# Patient Record
Sex: Female | Born: 1970 | Race: Black or African American | Hispanic: No | Marital: Married | Smoking: Never smoker
Health system: Southern US, Community
[De-identification: ages and names within clinical notes are randomized; demographics above are authoritative.]

## PROBLEM LIST (undated history)

## (undated) HISTORY — PX: WRIST SURGERY: SHX841

## (undated) HISTORY — PX: VEIN SURGERY: SHX48

## (undated) HISTORY — PX: TUBAL LIGATION: SHX77

---

## 2008-04-30 ENCOUNTER — Emergency Department (HOSPITAL_BASED_OUTPATIENT_CLINIC_OR_DEPARTMENT_OTHER): Admission: EM | Admit: 2008-04-30 | Discharge: 2008-04-30 | Payer: Self-pay | Admitting: Emergency Medicine

## 2008-05-01 ENCOUNTER — Emergency Department (HOSPITAL_BASED_OUTPATIENT_CLINIC_OR_DEPARTMENT_OTHER): Admission: EM | Admit: 2008-05-01 | Discharge: 2008-05-01 | Payer: Self-pay | Admitting: Emergency Medicine

## 2009-11-03 ENCOUNTER — Emergency Department (HOSPITAL_BASED_OUTPATIENT_CLINIC_OR_DEPARTMENT_OTHER): Admission: EM | Admit: 2009-11-03 | Discharge: 2009-11-04 | Payer: Self-pay | Admitting: Emergency Medicine

## 2010-04-21 ENCOUNTER — Emergency Department (HOSPITAL_BASED_OUTPATIENT_CLINIC_OR_DEPARTMENT_OTHER): Admission: EM | Admit: 2010-04-21 | Discharge: 2010-04-22 | Payer: Self-pay | Admitting: Emergency Medicine

## 2010-12-01 LAB — DIFFERENTIAL
Eosinophils Relative: 3 % (ref 0–5)
Lymphocytes Relative: 44 % (ref 12–46)
Lymphs Abs: 3.3 10*3/uL (ref 0.7–4.0)
Monocytes Absolute: 1.1 10*3/uL — ABNORMAL HIGH (ref 0.1–1.0)
Monocytes Relative: 14 % — ABNORMAL HIGH (ref 3–12)

## 2010-12-01 LAB — CBC
HCT: 32.4 % — ABNORMAL LOW (ref 36.0–46.0)
MCV: 91.3 fL (ref 78.0–100.0)
RBC: 3.55 MIL/uL — ABNORMAL LOW (ref 3.87–5.11)
RDW: 12.6 % (ref 11.5–15.5)
WBC: 7.6 10*3/uL (ref 4.0–10.5)

## 2010-12-01 LAB — BASIC METABOLIC PANEL
BUN: 17 mg/dL (ref 6–23)
Chloride: 107 mEq/L (ref 96–112)
Potassium: 3.8 mEq/L (ref 3.5–5.1)

## 2011-03-16 ENCOUNTER — Emergency Department (HOSPITAL_BASED_OUTPATIENT_CLINIC_OR_DEPARTMENT_OTHER)
Admission: EM | Admit: 2011-03-16 | Discharge: 2011-03-16 | Disposition: A | Discharge status: Home / Self Care | Attending: Emergency Medicine | Admitting: Emergency Medicine

## 2011-03-16 DIAGNOSIS — T394X5A Adverse effect of antirheumatics, not elsewhere classified, initial encounter: Secondary | ICD-10-CM | POA: Insufficient documentation

## 2011-03-16 DIAGNOSIS — R42 Dizziness and giddiness: Secondary | ICD-10-CM | POA: Insufficient documentation

## 2012-06-30 ENCOUNTER — Emergency Department (HOSPITAL_BASED_OUTPATIENT_CLINIC_OR_DEPARTMENT_OTHER)

## 2012-06-30 ENCOUNTER — Encounter (HOSPITAL_BASED_OUTPATIENT_CLINIC_OR_DEPARTMENT_OTHER): Payer: Self-pay | Admitting: Family Medicine

## 2012-06-30 ENCOUNTER — Emergency Department (HOSPITAL_BASED_OUTPATIENT_CLINIC_OR_DEPARTMENT_OTHER)
Admission: EM | Admit: 2012-06-30 | Discharge: 2012-06-30 | Disposition: A | Discharge status: Home / Self Care | Attending: Emergency Medicine | Admitting: Emergency Medicine

## 2012-06-30 DIAGNOSIS — M79609 Pain in unspecified limb: Secondary | ICD-10-CM | POA: Insufficient documentation

## 2012-06-30 DIAGNOSIS — M79661 Pain in right lower leg: Secondary | ICD-10-CM

## 2012-06-30 MED ORDER — NAPROXEN 500 MG PO TABS: 500.0000 mg | ORAL_TABLET | Freq: Two times a day (BID) | ORAL | Status: DC

## 2012-06-30 MED ORDER — OXYCODONE-ACETAMINOPHEN 5-325 MG PO TABS: 1.0000 | ORAL_TABLET | ORAL | Status: DC | PRN

## 2012-06-30 NOTE — ED Notes (Signed)
Patient transported to Ultrasound 

## 2012-06-30 NOTE — ED Notes (Signed)
Back from us

## 2012-06-30 NOTE — ED Notes (Signed)
Pt c/o "knot" to right calf since last Wed. Pt c/o pain with touch and walking. Pt denies shob, denies recent travel.

## 2012-06-30 NOTE — ED Provider Notes (Signed)
History     CSN: [id number]  Arrival date & time 06/30/12  1004   First MD Initiated Contact with Patient 06/30/12 1035      Chief Complaint  Patient presents with  . Leg Pain    (Consider location/radiation/quality/duration/timing/severity/associated sxs/prior treatment) Patient is a 41 y.o. female presenting with leg pain. The history is provided by the patient.  Leg Pain   She noticed a painful knot on her right calf 5 days ago. It is not getting worse, but it is not getting better. She denies any trauma. It is worse with standing and walking. Pain is 4/10 currently, but was 8/10 at its worst. She denies chest pain, dyspnea. She's not treated it with any medications.  History reviewed. No pertinent past medical history.  Past Surgical History  Procedure Date  . Tubal ligation   . Vein surgery   . Wrist surgery     No family history on file.  History  Substance Use Topics  . Smoking status: Never Smoker   . Smokeless tobacco: Not on file  . Alcohol Use: No    OB History    Grav Para Term Preterm Abortions TAB SAB Ect Mult Living                  Review of Systems  All other systems reviewed and are negative.    Allergies  Sulfa antibiotics  Home Medications  No current outpatient prescriptions on file.  BP 120/53  Pulse 79  Temp 99 F (37.2 C) (Oral)  Resp 18  Ht 5\' 7"  (1.702 m)  Wt 165 lb (74.844 kg)  BMI 25.84 kg/m2  SpO2 100%  LMP 06/10/2012  Physical Exam  Nursing note and vitals reviewed. 41year old female, resting comfortably and in no acute distress. Vital signs are normal. Oxygen saturation is 100%, which is normal. Head is normocephalic and atraumatic. PERRLA, EOMI. Oropharynx is clear. Neck is nontender and supple without adenopathy or JVD. Back is nontender and there is no CVA tenderness. Lungs are clear without rales, wheezes, or rhonchi. Chest is nontender. Heart has regular rate and rhythm without murmur. Abdomen is soft,  flat, nontender without masses or hepatosplenomegaly and peristalsis is normoactive. Extremities have no cyanosis or edema, full range of motion is present. Mildly tender subcutaneous nodules are present in the distal right calf. Right calf circumference is 1.5 cm greater than left calf circumference. There is no edema present. Homans sign is negative. Distal pulses and capillary refill are normal. Skin is warm and dry without rash. Neurologic: Mental status is normal, cranial nerves are intact, there are no motor or sensory deficits.   ED Course  Procedures (including critical care time)  US Venous Img Lower Unilateral Right  06/30/2012  *RADIOLOGY REPORT*  Clinical Data: Palpable lump right calf; past history of varicose vein ablation bilaterally  RIGHT LOWER EXTREMITY VENOUS DUPLEX ULTRASOUND  Technique:  Gray-scale sonography with graded compression, as well as color Doppler and duplex ultrasound, were performed to evaluate the deep venous system of the lower extremity from the level of the common femoral vein through the popliteal and proximal calf veins. Spectral Doppler was utilized to evaluate flow at rest and with distal augmentation maneuvers.  Comparison:  None  Findings: Deep venous system patent and compressible from right groin through popliteal fossa. Spontaneous venous flow present with intact augmentation and evidence of respiratory phasicity. No intraluminal thrombus identified. Visualized portion of the greater saphenous system unremarkable. No focal sonographic abnormalities identified  at the site of palpable concern.  IMPRESSION: No evidence of deep venous thrombosis in the right lower extremity.   Original Report Authenticated By: Lollie Marrow, M.D.      1. Pain of right lower leg       MDM  Calf pain of uncertain cause. Ultrasound will be obtained to rule out DVT.  Ultrasound does not show evidence of phlebitis. She is sent home with prescriptions for naproxen and  Percocet.     Dione Booze, MD 06/30/12 [phone]

## 2013-02-21 ENCOUNTER — Emergency Department (HOSPITAL_BASED_OUTPATIENT_CLINIC_OR_DEPARTMENT_OTHER)
Admission: EM | Admit: 2013-02-21 | Discharge: 2013-02-22 | Disposition: A | Discharge status: Home / Self Care | Attending: Emergency Medicine | Admitting: Emergency Medicine

## 2013-02-21 ENCOUNTER — Encounter (HOSPITAL_BASED_OUTPATIENT_CLINIC_OR_DEPARTMENT_OTHER): Payer: Self-pay | Admitting: *Deleted

## 2013-02-21 ENCOUNTER — Emergency Department (HOSPITAL_BASED_OUTPATIENT_CLINIC_OR_DEPARTMENT_OTHER)

## 2013-02-21 DIAGNOSIS — Z79899 Other long term (current) drug therapy: Secondary | ICD-10-CM | POA: Insufficient documentation

## 2013-02-21 DIAGNOSIS — M79675 Pain in left toe(s): Secondary | ICD-10-CM

## 2013-02-21 DIAGNOSIS — M79609 Pain in unspecified limb: Secondary | ICD-10-CM | POA: Insufficient documentation

## 2013-02-21 NOTE — ED Notes (Signed)
Pt c/o left great toe pain x 1 week. Saw PCP, but no xrays were taken. Continues to have pain and no relief with Ibuprofen and home remedies.

## 2013-02-21 NOTE — ED Provider Notes (Signed)
History  This chart was scribed for Jones Skene, MD by Jiles Prows, ED Scribe. The patient was seen in room MH02/MH02 and the patient's care was started at 11:59 PM.  CSN: [id number]  Arrival date & time 02/21/13  2205  Chief Complaint  Patient presents with  . Toe Pain    The history is provided by the patient and a significant other. No language interpreter was used.   HPI Comments: Jody Sanchez is a 42 y.o. female who presents to the Emergency Department complaining of moderate, constant, achy left toe pain that began about one week ago.  She rates this pain as a 5-6/10 when walking.  She claims her foot is sore to the touch.  Pt reports that she saw her PCP earlier this week, and he suggested that it was neither broken nor gout. She reports that she has taken 600 mg of ibuprofen 3 times a day for the past 5 days with no relief.  Pt denies headache, diaphoresis, fever, chills, nausea, vomiting, diarrhea, weakness, cough, SOB and any other pain. Pt denies any other joint problems.  Pt denies alcohol and smoking.  History reviewed. No pertinent past medical history.  Past Surgical History  Procedure Laterality Date  . Tubal ligation    . Vein surgery    . Wrist surgery      History reviewed. No pertinent family history.  History  Substance Use Topics  . Smoking status: Never Smoker   . Smokeless tobacco: Not on file  . Alcohol Use: No    OB History   Grav Para Term Preterm Abortions TAB SAB Ect Mult Living                  Review of Systems At least 10pt or greater review of systems completed and are negative except where specified in the HP.  Allergies  Sulfa antibiotics  Home Medications   Current Outpatient Rx  Name  Route  Sig  Dispense  Refill  . calcium carbonate (OS-CAL) 600 MG TABS   Oral   Take 1,200 mg by mouth daily.         Marland Kitchen loratadine-pseudoephedrine (CLARITIN-D 24-HOUR) 10-240 MG per 24 hr tablet   Oral   Take 1 tablet by mouth daily.          Marland Kitchen omeprazole (PRILOSEC) 20 MG capsule   Oral   Take 20 mg by mouth daily.         . naproxen (NAPROSYN) 500 MG tablet   Oral   Take 1 tablet (500 mg total) by mouth 2 (two) times daily.   30 tablet   0   . oxyCODONE-acetaminophen (ROXICET) 5-325 MG per tablet   Oral   Take 1 tablet by mouth every 4 (four) hours as needed for pain.   20 tablet   0     BP 127/46  Pulse 102  Temp(Src) 98.7 F (37.1 C) (Oral)  Resp 14  Ht 5\' 7"  (1.702 m)  Wt 173 lb (78.472 kg)  BMI 27.09 kg/m2  SpO2 98%  LMP 01/21/2013  Physical Exam  Nursing notes reviewed.  Electronic medical record reviewed. VITAL SIGNS:   Filed Vitals:   02/21/13 2214  BP: 127/46  Pulse: 102  Temp: 98.7 F (37.1 C)  TempSrc: Oral  Resp: 14  Height: 5\' 7"  (1.702 m)  Weight: 173 lb (78.472 kg)  SpO2: 98%   CONSTITUTIONAL: Awake, oriented, appears non-toxic HENT: Atraumatic, normocephalic, oral mucosa pink and moist, airway patent.  Nares patent without drainage. External ears normal. EYES: Conjunctiva clear, EOMI, PERRLA NECK: Trachea midline, non-tender, supple CARDIOVASCULAR: Normal heart rate, Normal rhythm, No murmurs, rubs, gallops PULMONARY/CHEST: Clear to auscultation, no rhonchi, wheezes, or rales. Symmetrical breath sounds. Non-tender. ABDOMINAL: Non-distended, soft, non-tender - no rebound or guarding.  BS normal. NEUROLOGIC: Non-focal, moving all four extremities, no gross sensory or motor deficits. EXTREMITIES: No clubbing, cyanosis, or edema. Small amount of swelling around the first MTP of the left foot. Tenderness to palpation especially on the medial aspect of the first MTP. No redness, able to range the joint actively and passively without severe pain. SKIN: Warm, Dry, No erythema, No rash  ED Course  Procedures (including critical care time) DIAGNOSTIC STUDIES: Oxygen Saturation is 98% on RA, normal by my interpretation.    COORDINATION OF CARE: 12:02 AM - Discussed ED treatment with  pt at bedside and pt agrees.  12:48 AM - Local anesthetic marcaine injection administered over left great toe joint.   Labs Reviewed - No data to display Dg Toe Great Left  02/21/2013   *RADIOLOGY REPORT*  Clinical Data: Left great toe pain.  LEFT GREAT TOE  Comparison: None  Findings: There is no evidence of fracture, subluxation or dislocation. The joint spaces are unremarkable. No focal bony lesions are present. No radiopaque foreign bodies are identified.  IMPRESSION: Unremarkable exam.   Original Report Authenticated By: Harmon Pier, M.D.     1. Pain of left great toe       MDM  Patient complaining about left great toe pain. Patient seemed primary care physician who do not think a fracture was present. I agree, do not think fractures present, x-ray of the toe shows no acute fracture, likewise this joint does not look infected, it's not completely consistent with gout however there is still a possibility. I do not think it is enough of a possibility to start this patient on colchicine however. Still treat this patient conservatively with NSAIDs. Also give the patient an injection into the medial aspect of her MTP for relief with bupivacaine. I discussed risks and benefits of placing a small amount of local anesthetic in this region including risk of bleeding, infection and damage to adjacent structures, patient understands risks and benefits of this small procedure and agrees to proceed with it. Patient received great amount of relief with placement of bupivacaine.  We'll also have the patient placed in a postop shoe, avoid bending the toe, avoid any high healed shoes- wear comfortable wide toe box shoes. Followup as needed    I personally performed the services described in this documentation, which was scribed in my presence. The recorded information has been reviewed and is accurate. Jones Skene, M.D.     Jones Skene, MD 02/22/13 [phone]

## 2013-02-22 MED ORDER — BUPIVACAINE HCL 0.5 % IJ SOLN
50.0000 mL | Freq: Once | INTRAMUSCULAR | Status: DC
  Filled 2013-02-22: qty 1

## 2013-02-26 NOTE — ED Notes (Signed)
Opened this chart due to pts primary care office ...[facility] [place] states pt came to them stating they had to fill out an authorization form opened chart to see if pt had been referred to another provider.

## 2013-04-05 ENCOUNTER — Encounter (HOSPITAL_BASED_OUTPATIENT_CLINIC_OR_DEPARTMENT_OTHER): Payer: Self-pay | Admitting: *Deleted

## 2013-04-05 ENCOUNTER — Emergency Department (HOSPITAL_BASED_OUTPATIENT_CLINIC_OR_DEPARTMENT_OTHER)

## 2013-04-05 ENCOUNTER — Emergency Department (HOSPITAL_BASED_OUTPATIENT_CLINIC_OR_DEPARTMENT_OTHER)
Admission: EM | Admit: 2013-04-05 | Discharge: 2013-04-05 | Disposition: A | Discharge status: Home / Self Care | Attending: Emergency Medicine | Admitting: Emergency Medicine

## 2013-04-05 DIAGNOSIS — J209 Acute bronchitis, unspecified: Secondary | ICD-10-CM | POA: Insufficient documentation

## 2013-04-05 DIAGNOSIS — J3489 Other specified disorders of nose and nasal sinuses: Secondary | ICD-10-CM | POA: Insufficient documentation

## 2013-04-05 DIAGNOSIS — Z791 Long term (current) use of non-steroidal anti-inflammatories (NSAID): Secondary | ICD-10-CM | POA: Insufficient documentation

## 2013-04-05 DIAGNOSIS — R079 Chest pain, unspecified: Secondary | ICD-10-CM | POA: Insufficient documentation

## 2013-04-05 DIAGNOSIS — R509 Fever, unspecified: Secondary | ICD-10-CM | POA: Insufficient documentation

## 2013-04-05 DIAGNOSIS — R0602 Shortness of breath: Secondary | ICD-10-CM | POA: Insufficient documentation

## 2013-04-05 DIAGNOSIS — Z79899 Other long term (current) drug therapy: Secondary | ICD-10-CM | POA: Insufficient documentation

## 2013-04-05 DIAGNOSIS — J40 Bronchitis, not specified as acute or chronic: Secondary | ICD-10-CM

## 2013-04-05 DIAGNOSIS — R110 Nausea: Secondary | ICD-10-CM | POA: Insufficient documentation

## 2013-04-05 MED ORDER — ALBUTEROL SULFATE HFA 108 (90 BASE) MCG/ACT IN AERS
1.0000 | INHALATION_SPRAY | RESPIRATORY_TRACT | Status: DC | PRN
  Administered 2013-04-05: 2 via RESPIRATORY_TRACT
  Filled 2013-04-05: qty 6.7

## 2013-04-05 MED ORDER — HYDROCODONE-ACETAMINOPHEN 7.5-325 MG/15ML PO SOLN: 15.0000 mL | Freq: Four times a day (QID) | ORAL | Status: AC | PRN

## 2013-04-05 MED ORDER — AZITHROMYCIN 250 MG PO TABS: 250.0000 mg | ORAL_TABLET | Freq: Every day | ORAL | Status: DC

## 2013-04-05 NOTE — ED Provider Notes (Signed)
History    This chart was scribed for Gwyneth Sprout, MD by Quintella Reichert, ED scribe.  This patient was seen in room MH09/MH09 and the patient's care was started at 4:22 PM.   CSN: [id number]  Arrival date & time 04/05/13  1457    Chief Complaint  Patient presents with  . Cough    The history is provided by the patient. No language interpreter was used.     HPI Comments: Jody Sanchez is a 42 y.o. female who presents to the Emergency Department complaining of 4 days of progressively-worsening moderate cough, with accompanying congestion, SOB, waxing-and-waning fever, sore throat, and intermittent nausea.  Pt notes recent contact with similar symptoms.  She reports that her cough is worse at night.  It is not exacerbated or alleviated by anything.  She also complains of some burning CP caused by her cough.  She notes that her fever has been improving but that her respiratory symptoms have been worsening.  On admission temperature is 99.6 F.  She denies wheezing, emesis, diarrhea or any other associated symptoms.  She denies h/o asthma.     History reviewed. No pertinent past medical history.   Past Surgical History  Procedure Laterality Date  . Tubal ligation    . Vein surgery    . Wrist surgery    . Tubal ligation      History reviewed. No pertinent family history.   History  Substance Use Topics  . Smoking status: Never Smoker   . Smokeless tobacco: Not on file  . Alcohol Use: No    OB History   Grav Para Term Preterm Abortions TAB SAB Ect Mult Living                  Review of Systems A complete 10 system review of systems was obtained and all systems are negative except as noted in the HPI and PMH.     Allergies  Sulfa antibiotics  Home Medications   Current Outpatient Rx  Name  Route  Sig  Dispense  Refill  . calcium carbonate (OS-CAL) 600 MG TABS   Oral   Take 1,200 mg by mouth daily.         Marland Kitchen loratadine-pseudoephedrine (CLARITIN-D 24-HOUR)  10-240 MG per 24 hr tablet   Oral   Take 1 tablet by mouth daily.         . naproxen (NAPROSYN) 500 MG tablet   Oral   Take 1 tablet (500 mg total) by mouth 2 (two) times daily.   30 tablet   0   . omeprazole (PRILOSEC) 20 MG capsule   Oral   Take 20 mg by mouth daily.         Marland Kitchen oxyCODONE-acetaminophen (ROXICET) 5-325 MG per tablet   Oral   Take 1 tablet by mouth every 4 (four) hours as needed for pain.   20 tablet   0    BP 126/64  Pulse 78  Temp(Src) 99.6 F (37.6 C) (Oral)  Ht 5\' 6"  (1.676 m)  Wt 165 lb (74.844 kg)  BMI 26.64 kg/m2  SpO2 97%  LMP 03/17/2013  Physical Exam  Nursing note and vitals reviewed. Constitutional: She is oriented to person, place, and time. She appears well-developed and well-nourished. No distress.  HENT:  Head: Normocephalic and atraumatic.  Right Ear: Tympanic membrane normal.  Left Ear: Tympanic membrane normal.  Swollen nasal turbinates bilaterally. Pharyngeal erythema without exudate or tonsillar enlargement.  Eyes: EOM are normal.  Neck:  Neck supple. No tracheal deviation present.  Cardiovascular: Normal rate.   Pulmonary/Chest: Effort normal and breath sounds normal. No respiratory distress. She has no wheezes. She has no rales.  Musculoskeletal: Normal range of motion.  Neurological: She is alert and oriented to person, place, and time.  Skin: Skin is warm and dry.  Psychiatric: She has a normal mood and affect. Her behavior is normal.    ED Course  Procedures (including critical care time)  DIAGNOSTIC STUDIES: Oxygen Saturation is 97% on room air, normal by my interpretation.    COORDINATION OF CARE: 4:27 PM-Discussed treatment plan which includes albuterol inhaler and antibiotics with pt at bedside and pt agreed to plan.     Labs Reviewed - No data to display  Dg Chest 2 View  04/05/2013   *RADIOLOGY REPORT*  Clinical Data: Left chest discomfort, cough, fever  CHEST - 2 VIEW  Comparison: None.  Findings: Lungs  are essentially clear.  No focal consolidation.  No pleural effusion or pneumothorax.  The heart is normal in size.  Mild degenerative changes of the visualized thoracolumbar spine.  IMPRESSION: No evidence of acute cardiopulmonary disease.   Original Report Authenticated By: Charline Bills, M.D.   1. Bronchitis     MDM   Pt with symptoms consistent with viral URI.  Well appearing here.  No signs of breathing difficulty  No signs of pharyngitis, otitis or abnormal abdominal findings.   CXR wnl and pt to return with any further problems.     I personally performed the services described in this documentation, which was scribed in my presence.  The recorded information has been reviewed and considered.    Gwyneth Sprout, MD 04/05/13 2255

## 2013-04-05 NOTE — ED Notes (Signed)
Patient with c/o cough that started a few days ago.  Patient c/o fever that started on Wednesday and a few days later, she started coughing.  Chest hurts when she coughs and voice is hoarse.

## 2013-06-17 ENCOUNTER — Encounter (HOSPITAL_BASED_OUTPATIENT_CLINIC_OR_DEPARTMENT_OTHER): Payer: Self-pay | Admitting: Emergency Medicine

## 2013-06-17 ENCOUNTER — Emergency Department (HOSPITAL_BASED_OUTPATIENT_CLINIC_OR_DEPARTMENT_OTHER)
Admission: EM | Admit: 2013-06-17 | Discharge: 2013-06-17 | Disposition: A | Discharge status: Home / Self Care | Attending: Emergency Medicine | Admitting: Emergency Medicine

## 2013-06-17 DIAGNOSIS — J069 Acute upper respiratory infection, unspecified: Secondary | ICD-10-CM | POA: Insufficient documentation

## 2013-06-17 DIAGNOSIS — Z792 Long term (current) use of antibiotics: Secondary | ICD-10-CM | POA: Insufficient documentation

## 2013-06-17 DIAGNOSIS — Z79899 Other long term (current) drug therapy: Secondary | ICD-10-CM | POA: Insufficient documentation

## 2013-06-17 DIAGNOSIS — R0602 Shortness of breath: Secondary | ICD-10-CM | POA: Insufficient documentation

## 2013-06-17 DIAGNOSIS — R0789 Other chest pain: Secondary | ICD-10-CM | POA: Insufficient documentation

## 2013-06-17 DIAGNOSIS — R05 Cough: Secondary | ICD-10-CM

## 2013-06-17 DIAGNOSIS — Z791 Long term (current) use of non-steroidal anti-inflammatories (NSAID): Secondary | ICD-10-CM | POA: Insufficient documentation

## 2013-06-17 MED ORDER — ALBUTEROL SULFATE HFA 108 (90 BASE) MCG/ACT IN AERS
2.0000 | INHALATION_SPRAY | RESPIRATORY_TRACT | Status: DC | PRN
  Administered 2013-06-17: 2 via RESPIRATORY_TRACT
  Filled 2013-06-17: qty 6.7

## 2013-06-17 MED ORDER — ALBUTEROL SULFATE HFA 108 (90 BASE) MCG/ACT IN AERS: 2.0000 | INHALATION_SPRAY | RESPIRATORY_TRACT | Status: DC | PRN

## 2013-06-17 NOTE — ED Notes (Signed)
Pt stated she had bronchitis 2 months ago and finished all med. Still has cough and cough has gotten more productive in the last week.

## 2013-06-17 NOTE — ED Notes (Signed)
Pt c/o cough x 1 week. Pt states cough has been dry until today with mild sputum production. Pt states burning and tightness in chest started today.

## 2013-06-17 NOTE — ED Provider Notes (Signed)
CSN: [id number]     Arrival date & time 06/17/13  1912 History  This chart was scribed for Shon Baton, MD by Karle Plumber, ED Scribe. This patient was seen in room MH11/MH11 and the patient's care was started at 7:48 PM.  Chief Complaint  Patient presents with  . Cough   The history is provided by the patient. No language interpreter was used.   HPI Comments:  Jody Sanchez is a 42 y.o. female who presents to the Emergency Department complaining of intermittent, moderate cough onset one week. Pt states the cough was unproductive until today she started producing sputum. She reports associated chest tightness and burning onset three days that worsens when coughing. Pt reports taking Robitussin with mild relief. Pt reports using an inhaler in the past for treatment of bronchitis. Pt denies urinary symptoms, fevers, or any other pertinent illnesses or symptoms. She states her PCP is [facility] in [place].  History reviewed. No pertinent past medical history. Past Surgical History  Procedure Laterality Date  . Tubal ligation    . Vein surgery    . Wrist surgery    . Tubal ligation     No family history on file. History  Substance Use Topics  . Smoking status: Never Smoker   . Smokeless tobacco: Not on file  . Alcohol Use: No   OB History   Grav Para Term Preterm Abortions TAB SAB Ect Mult Living                 Review of Systems  Constitutional: Negative for fever.  Respiratory: Positive for cough, chest tightness and shortness of breath.        States SOB and chest tightness is due to coughing and worsens with cough.  Cardiovascular: Negative for chest pain.  Gastrointestinal: Negative for nausea, vomiting and abdominal pain.  Genitourinary: Negative for dysuria.  Musculoskeletal: Negative for back pain.  Skin: Negative for wound.  Neurological: Negative for headaches.  Psychiatric/Behavioral: Negative for confusion.  All other systems reviewed and are  negative.    Allergies  Sulfa antibiotics  Home Medications   Current Outpatient Rx  Name  Route  Sig  Dispense  Refill  . Pseudoephedrine-Ibuprofen (ADVIL COLD/SINUS PO)   Oral   Take by mouth.         Marland Kitchen albuterol (PROVENTIL HFA;VENTOLIN HFA) 108 (90 BASE) MCG/ACT inhaler   Inhalation   Inhale 2 puffs into the lungs every 4 (four) hours as needed for wheezing or shortness of breath.   1 Inhaler   0   . azithromycin (ZITHROMAX) 250 MG tablet   Oral   Take 1 tablet (250 mg total) by mouth daily. Take first 2 tablets together, then 1 every day until finished.   6 tablet   0   . calcium carbonate (OS-CAL) 600 MG TABS   Oral   Take 1,200 mg by mouth daily.         Marland Kitchen HYDROcodone-acetaminophen (HYCET) 7.5-325 mg/15 ml solution   Oral   Take 15 mLs by mouth 4 (four) times daily as needed for pain.   120 mL   0   . loratadine-pseudoephedrine (CLARITIN-D 24-HOUR) 10-240 MG per 24 hr tablet   Oral   Take 1 tablet by mouth daily.         . naproxen (NAPROSYN) 500 MG tablet   Oral   Take 1 tablet (500 mg total) by mouth 2 (two) times daily.   30 tablet  0   . omeprazole (PRILOSEC) 20 MG capsule   Oral   Take 20 mg by mouth daily.         Marland Kitchen oxyCODONE-acetaminophen (ROXICET) 5-325 MG per tablet   Oral   Take 1 tablet by mouth every 4 (four) hours as needed for pain.   20 tablet   0    Triage Vitals: BP 126/70  Pulse 68  Temp(Src) 98.7 F (37.1 C) (Oral)  Resp 18  Ht 5\' 7"  (1.702 m)  Wt 160 lb (72.576 kg)  BMI 25.05 kg/m2  SpO2 99% Physical Exam  Nursing note and vitals reviewed. Constitutional: She is oriented to person, place, and time. She appears well-developed and well-nourished.  HENT:  Head: Normocephalic and atraumatic.  Cardiovascular: Normal rate, regular rhythm and normal heart sounds.   Pulmonary/Chest: Effort normal and breath sounds normal. No respiratory distress. She has no wheezes. She has no rales. She exhibits no tenderness.   coughing  Abdominal: Soft. Bowel sounds are normal. She exhibits no distension and no mass. There is no tenderness. There is no rebound and no guarding.  Neurological: She is alert and oriented to person, place, and time.  Skin: Skin is warm and dry.  Psychiatric: She has a normal mood and affect.    ED Course  Procedures (including critical care time) DIAGNOSTIC STUDIES: Oxygen Saturation is 99% on RA, normal by my interpretation.   COORDINATION OF CARE: 7:43 PM- Advised pt to continue the OTC treatments for her symptoms and add Motrin for any chest discomfort. Will perform an EKG to r/o any cardiac issues. Will give pt albuterol MDI for any SOB. Advised to follow up with PCP if symptoms do not improve over the next 2-3 days. Pt verbalizes understanding and agrees to plan.  Medications - No data to display  Labs Review Labs Reviewed - No data to display Imaging Review No results found.  EKG independently reviewed by myself: Sinus rhythm with a rate of 59, no evidence of ST elevation or ischemia, normal EKG. No prior for comparison MDM   1. Cough   2. Upper respiratory infection    Patient with URI symptoms and predominate cough.  Does endorse chest burning.  Afebrile and nontoxic.  Will given albuterol inhaler for cough.  Screening EKG normal and low suspicion for ACS; CP likely secondary to URI.    NO indication for chest xray at this time.  Patient to follow-up with PCP.  GIven return precautions.  After history, exam, and medical workup I feel the patient has been appropriately medically screened and is safe for discharge home. Pertinent diagnoses were discussed with the patient. Patient was given return precautions.  I personally performed the services described in this documentation, which was scribed in my presence. The recorded information has been reviewed and is accurate.   Shon Baton, MD 06/18/13 [phone]

## 2014-08-17 ENCOUNTER — Encounter (HOSPITAL_BASED_OUTPATIENT_CLINIC_OR_DEPARTMENT_OTHER): Payer: Self-pay | Admitting: *Deleted

## 2014-08-17 ENCOUNTER — Emergency Department (HOSPITAL_BASED_OUTPATIENT_CLINIC_OR_DEPARTMENT_OTHER)
Admission: EM | Admit: 2014-08-17 | Discharge: 2014-08-17 | Disposition: A | Discharge status: Home / Self Care | Attending: Emergency Medicine | Admitting: Emergency Medicine

## 2014-08-17 DIAGNOSIS — Z9851 Tubal ligation status: Secondary | ICD-10-CM | POA: Insufficient documentation

## 2014-08-17 DIAGNOSIS — Z7952 Long term (current) use of systemic steroids: Secondary | ICD-10-CM | POA: Diagnosis not present

## 2014-08-17 DIAGNOSIS — Z79899 Other long term (current) drug therapy: Secondary | ICD-10-CM | POA: Diagnosis not present

## 2014-08-17 DIAGNOSIS — Z791 Long term (current) use of non-steroidal anti-inflammatories (NSAID): Secondary | ICD-10-CM | POA: Insufficient documentation

## 2014-08-17 DIAGNOSIS — R1111 Vomiting without nausea: Secondary | ICD-10-CM

## 2014-08-17 DIAGNOSIS — R112 Nausea with vomiting, unspecified: Secondary | ICD-10-CM | POA: Diagnosis not present

## 2014-08-17 DIAGNOSIS — R197 Diarrhea, unspecified: Secondary | ICD-10-CM | POA: Diagnosis present

## 2014-08-17 MED ORDER — ONDANSETRON 4 MG PO TBDP
ORAL_TABLET | ORAL | Status: AC
  Filled 2014-08-17: qty 1

## 2014-08-17 MED ORDER — ONDANSETRON 4 MG PO TBDP
4.0000 mg | ORAL_TABLET | Freq: Once | ORAL | Status: AC
  Administered 2014-08-17: 4 mg via ORAL

## 2014-08-17 MED ORDER — ONDANSETRON 4 MG PO TBDP: 4.0000 mg | ORAL_TABLET | Freq: Three times a day (TID) | ORAL | Status: AC | PRN

## 2014-08-17 NOTE — ED Notes (Signed)
Pt c/o n/d x 3 days

## 2014-08-17 NOTE — ED Provider Notes (Signed)
CSN: [id number]     Arrival date & time 08/17/14  1448 History   First MD Initiated Contact with Patient 08/17/14 1502     Chief Complaint  Patient presents with  . Diarrhea     (Consider location/radiation/quality/duration/timing/severity/associated sxs/prior Treatment) Patient is a 43 y.o. female presenting with diarrhea. The history is provided by the patient. No language interpreter was used.  Diarrhea Severity:  Moderate Onset quality:  Gradual Number of episodes:  Multiple Duration:  3 days Timing:  Constant Progression:  Unchanged Relieved by:  Nothing Worsened by:  Nothing tried Ineffective treatments:  None tried Risk factors: no recent antibiotic use   Pt here with 2 family members who have the same  History reviewed. No pertinent past medical history. Past Surgical History  Procedure Laterality Date  . Tubal ligation    . Vein surgery    . Wrist surgery    . Tubal ligation     History reviewed. No pertinent family history. History  Substance Use Topics  . Smoking status: Never Smoker   . Smokeless tobacco: Not on file  . Alcohol Use: No   OB History    No data available     Review of Systems  Gastrointestinal: Positive for diarrhea.  All other systems reviewed and are negative.     Allergies  Sulfa antibiotics  Home Medications   Prior to Admission medications   Medication Sig Start Date End Date Taking? Authorizing Provider  fluticasone (FLONASE) 50 MCG/ACT nasal spray Place into both nostrils daily.   Yes Historical Provider, MD  calcium carbonate (OS-CAL) 600 MG TABS Take 1,200 mg by mouth daily.    Historical Provider, MD  loratadine-pseudoephedrine (CLARITIN-D 24-HOUR) 10-240 MG per 24 hr tablet Take 1 tablet by mouth daily.    Historical Provider, MD  naproxen (NAPROSYN) 500 MG tablet Take 1 tablet (500 mg total) by mouth 2 (two) times daily. 06/30/12   Dione Boozeavid Glick, MD  omeprazole (PRILOSEC) 20 MG capsule Take 20 mg by mouth daily.     Historical Provider, MD  ondansetron (ZOFRAN ODT) 4 MG disintegrating tablet Take 1 tablet (4 mg total) by mouth every 8 (eight) hours as needed for nausea or vomiting. 08/17/14   Elson AreasLeslie K Peder Allums, PA-C  Pseudoephedrine-Ibuprofen (ADVIL COLD/SINUS PO) Take by mouth.    Historical Provider, MD   BP 124/68 mmHg  Pulse 100  Temp(Src) 99.7 F (37.6 C)  Resp 18  Ht 5\' 7"  (1.702 m)  Wt 180 lb (81.647 kg)  BMI 28.19 kg/m2  SpO2 97%  LMP 08/11/2014 Physical Exam  Constitutional: She appears well-developed.  HENT:  Head: Normocephalic and atraumatic.  Eyes: Conjunctivae and EOM are normal. Pupils are equal, round, and reactive to light.  Neck: Normal range of motion. Neck supple.  Cardiovascular: Normal rate and regular rhythm.   Pulmonary/Chest: Effort normal and breath sounds normal.  Abdominal: Soft.  Musculoskeletal: Normal range of motion.  Neurological: She is alert.  Skin: Skin is warm.  Nursing note and vitals reviewed.   ED Course  Procedures (including critical care time) Labs Review Labs Reviewed - No data to display  Imaging Review No results found.   EKG Interpretation None      MDM   Final diagnoses:  Vomiting without nausea, vomiting of unspecified type    Pt given odt zofran.  Pt able to tolerate po fluids.  Pt advised to follow up with her Md if symptoms persist    Elson AreasLeslie K Roseanna Koplin, [place] JerseyPA-C 08/17/14 1635  Mirian MoMatthew Gentry, MD 08/22/14 [phone]

## 2014-08-17 NOTE — Discharge Instructions (Signed)

## 2016-09-20 ENCOUNTER — Emergency Department (HOSPITAL_BASED_OUTPATIENT_CLINIC_OR_DEPARTMENT_OTHER)

## 2016-09-20 ENCOUNTER — Emergency Department (HOSPITAL_BASED_OUTPATIENT_CLINIC_OR_DEPARTMENT_OTHER)
Admission: EM | Admit: 2016-09-20 | Discharge: 2016-09-20 | Disposition: A | Discharge status: Home / Self Care | Attending: Emergency Medicine | Admitting: Emergency Medicine

## 2016-09-20 ENCOUNTER — Encounter (HOSPITAL_BASED_OUTPATIENT_CLINIC_OR_DEPARTMENT_OTHER): Payer: Self-pay

## 2016-09-20 DIAGNOSIS — M25551 Pain in right hip: Secondary | ICD-10-CM | POA: Insufficient documentation

## 2016-09-20 DIAGNOSIS — M5431 Sciatica, right side: Secondary | ICD-10-CM | POA: Insufficient documentation

## 2016-09-20 DIAGNOSIS — M79604 Pain in right leg: Secondary | ICD-10-CM | POA: Diagnosis present

## 2016-09-20 LAB — PREGNANCY, URINE: PREG TEST UR: NEGATIVE

## 2016-09-20 MED ORDER — HYDROCODONE-ACETAMINOPHEN 5-325 MG PO TABS: 1.0000 | ORAL_TABLET | Freq: Four times a day (QID) | ORAL | 0 refills | Status: DC | PRN

## 2016-09-20 MED ORDER — KETOROLAC TROMETHAMINE 60 MG/2ML IM SOLN
60.0000 mg | Freq: Once | INTRAMUSCULAR | Status: AC
  Administered 2016-09-20: 60 mg via INTRAMUSCULAR
  Filled 2016-09-20: qty 2

## 2016-09-20 MED ORDER — HYDROMORPHONE HCL 1 MG/ML IJ SOLN
2.0000 mg | Freq: Once | INTRAMUSCULAR | Status: AC
  Administered 2016-09-20: 2 mg via INTRAMUSCULAR
  Filled 2016-09-20: qty 2

## 2016-09-20 MED ORDER — NAPROXEN 500 MG PO TABS: 500.0000 mg | ORAL_TABLET | Freq: Two times a day (BID) | ORAL | 0 refills | Status: DC

## 2016-09-20 MED ORDER — CYCLOBENZAPRINE HCL 10 MG PO TABS: 10.0000 mg | ORAL_TABLET | Freq: Three times a day (TID) | ORAL | 0 refills | Status: AC | PRN

## 2016-09-20 MED FILL — NAPROXEN 500 MG TABLET: 500 | 7 days supply | Qty: 14 | Fill #0

## 2016-09-20 MED FILL — HYDROCODON-APAP 5-325: 5-325 | 2 days supply | Qty: 10 | Fill #0

## 2016-09-20 MED FILL — CYCLOBENZAPRINE 10 MG TAB: 10 | 5 days supply | Qty: 15 | Fill #0

## 2016-09-20 NOTE — ED Notes (Signed)
Pt has a ride at bedside. Directed to pharmacy to pick up Rx 

## 2016-09-20 NOTE — Discharge Instructions (Signed)
Your Xray showed a "Rounded corticated bony density" at your right hip. Your Doctor can help set up an MRI of your hip to further clarify this. If you develop trouble walking or inability to walk, come back for further evaluation.  Return immediately if you develop weakness/numbness or trouble controlling your bowel or bladder

## 2016-09-20 NOTE — ED Provider Notes (Signed)
[facility]   CSN: [id number] Arrival date & time: 09/20/16  0458     History   Chief Complaint Chief Complaint  Patient presents with  . Leg Pain    HPI Nils Flackracy Weissman is a 46 y.o. female.  HPI  46 year old female presents with severe right leg pain. States it started last week. Seemed to be worse after sitting during a long drive. Pain is at its worst at her right lateral hip. Also has some buttocks pain that radiates down into her knee. Pain has progressively become severe over the last 2 days to the point that she can't hardly bear weight on her leg and has to limp. There is no weakness, just severe pain. Has tried extra strength Tylenol with no relief. There is no weakness. She felt transient toe tingling in her right foot yesterday but no further numbness. No bowel or bladder incontinence. No trauma. Denies fevers, dysuria, hematuria, or abdominal pain. No prior back problems. When she is still her pain is minimal, severe when moving.  History reviewed. No pertinent past medical history.  There are no active problems to display for this patient.   Past Surgical History:  Procedure Laterality Date  . TUBAL LIGATION    . TUBAL LIGATION    . VEIN SURGERY    . WRIST SURGERY      OB History    No data available       Home Medications    Prior to Admission medications   Medication Sig Start Date End Date Taking? Authorizing Provider  calcium carbonate (OS-CAL) 600 MG TABS Take 1,200 mg by mouth daily.    Historical Provider, MD  cyclobenzaprine (FLEXERIL) 10 MG tablet Take 1 tablet (10 mg total) by mouth 3 (three) times daily as needed for muscle spasms. 09/20/16   Pricilla LovelessScott Dorthie Santini, MD  fluticasone (FLONASE) 50 MCG/ACT nasal spray Place into both nostrils daily.    Historical Provider, MD  HYDROcodone-acetaminophen (NORCO) 5-325 MG tablet Take 1-2 tablets by mouth every 6 (six) hours as needed for severe pain. 09/20/16   Pricilla LovelessScott Arnetra Terris, MD    loratadine-pseudoephedrine (CLARITIN-D 24-HOUR) 10-240 MG per 24 hr tablet Take 1 tablet by mouth daily.    Historical Provider, MD  naproxen (NAPROSYN) 500 MG tablet Take 1 tablet (500 mg total) by mouth 2 (two) times daily with a meal. 09/20/16   Pricilla LovelessScott Maritssa Haughton, MD  omeprazole (PRILOSEC) 20 MG capsule Take 20 mg by mouth daily.    Historical Provider, MD  ondansetron (ZOFRAN ODT) 4 MG disintegrating tablet Take 1 tablet (4 mg total) by mouth every 8 (eight) hours as needed for nausea or vomiting. 08/17/14   Elson AreasLeslie K Sofia, PA-C  Pseudoephedrine-Ibuprofen (ADVIL COLD/SINUS PO) Take by mouth.    Historical Provider, MD    Family History No family history on file.  Social History Social History  Substance Use Topics  . Smoking status: Never Smoker  . Smokeless tobacco: Never Used  . Alcohol use No     Allergies   Sulfa antibiotics   Review of Systems Review of Systems  Constitutional: Negative for fever.  Cardiovascular: Negative for leg swelling.  Gastrointestinal: Negative for abdominal pain.  Genitourinary: Negative for dysuria and hematuria.  Musculoskeletal: Positive for back pain.  Neurological: Negative for weakness and numbness.  All other systems reviewed and are negative.    Physical Exam Updated Vital Signs BP 139/68 (BP Location: Right Arm)   Pulse 90   Temp 98.5 F (36.9  C) (Oral)   Resp 20   Ht 5\' 7"  (1.702 m)   Wt 180 lb (81.6 kg)   SpO2 97%   BMI 28.19 kg/m   Physical Exam  Constitutional: She is oriented to person, place, and time. She appears well-developed and well-nourished.  HENT:  Head: Normocephalic and atraumatic.  Right Ear: External ear normal.  Left Ear: External ear normal.  Nose: Nose normal.  Eyes: Right eye exhibits no discharge. Left eye exhibits no discharge.  Cardiovascular: Normal rate and regular rhythm.   Pulses:      Dorsalis pedis pulses are 2+ on the right side, and 2+ on the left side.  Pulmonary/Chest: Effort normal.   Abdominal: She exhibits no distension.  Musculoskeletal: She exhibits no edema.       Right hip: She exhibits no tenderness.       Right knee: No tenderness found.       Thoracic back: She exhibits no tenderness.       Lumbar back: She exhibits no tenderness.       Right upper leg: She exhibits no tenderness.  No hip or greater trochanter tenderness. Pain exacerbated by ROM of hip  Neurological: She is alert and oriented to person, place, and time. She has normal reflexes.  5/5 strength in BLE. Causes severe pain in RLE but she has full strength. Normal gross sensation  Skin: Skin is warm and dry. She is not diaphoretic.  Nursing note and vitals reviewed.    ED Treatments / Results  Labs (all labs ordered are listed, but only abnormal results are displayed) Labs Reviewed  PREGNANCY, URINE    EKG  EKG Interpretation None       Radiology Dg Lumbar Spine Complete  Result Date: 09/20/2016 CLINICAL DATA:  Hip pain. EXAM: LUMBAR SPINE - COMPLETE 4+ VIEW COMPARISON:  No recent prior . FINDINGS: No acute bony abnormality identified. Normal alignment mineralization. Diffuse multilevel degenerative change. Moderate stool volume noted. IMPRESSION: No acute or focal abnormality. Diffuse multilevel degenerative change. Electronically Signed   By: Maisie Fus  Register   On: 09/20/2016 07:07   Dg Hip Unilat With Pelvis 2-3 Views Right  Result Date: 09/20/2016 CLINICAL DATA:  Pain.  No injury. EXAM: DG HIP (WITH OR WITHOUT PELVIS) 2-3V RIGHT COMPARISON:  02/17/2013 . FINDINGS: No acute bony or joint abnormality identified. Corticated bony density is noted adjacent to the right femoral neck. This may be related to old injury, ligamentous calcification, or loose body. MRI of the right hip can be obtained for further evaluation as needed. Lucency is noted over the right ilium. This appears corticated. Although an active lesion cannot be excluded. This is most likely benign cyst. IMPRESSION: 1. Rounded  corticated bony density noted adjacent to the right femoral neck. This may be related old injury, ligamentous calcification, or loose body. MRI of the right hip can be obtained for further evaluation. 2. Corticated lucency noted over the right ilium. Although an active lesion cannot be excluded. This is most likely benign cyst. 3. No acute bony abnormality. No evidence of acute fracture or dislocation. Electronically Signed   By: Maisie Fus  Register   On: 09/20/2016 07:06    Procedures Procedures (including critical care time)  Medications Ordered in ED Medications  HYDROmorphone (DILAUDID) injection 2 mg (2 mg Intramuscular Given 09/20/16 0549)  ketorolac (TORADOL) injection 60 mg (60 mg Intramuscular Given 09/20/16 0549)     Initial Impression / Assessment and Plan / ED Course  I have reviewed the  triage vital signs and the nursing notes.  Pertinent labs & imaging results that were available during my care of the patient were reviewed by me and considered in my medical decision making (see chart for details).  Clinical Course as of Sep 20 725  Thu Sep 20, 2016  1610 This is most likely sciatica. However given no prior history, will get xray of L-spine and hip given pain with ROM. IM dilaudid, IM toradol  [SG]    Clinical Course User Index [SG] Pricilla Loveless, MD    Patient's pain is much better. She is able to get up and ambulate with a mild limp. However she is bearing weight. My suspicion for an occult hip fracture is quite low. However given the nonspecific findings on her hip x-ray, I discussed the importance of getting an outpatient MRI by her PCP. Given suspicion of occult fracture is low, I do not think this needs to be done emergently. Discussed strict return precautions.  Final Clinical Impressions(s) / ED Diagnoses   Final diagnoses:  Right sided sciatica  Right hip pain    New Prescriptions New Prescriptions   CYCLOBENZAPRINE (FLEXERIL) 10 MG TABLET    Take 1 tablet (10 mg  total) by mouth 3 (three) times daily as needed for muscle spasms.   HYDROCODONE-ACETAMINOPHEN (NORCO) 5-325 MG TABLET    Take 1-2 tablets by mouth every 6 (six) hours as needed for severe pain.   NAPROXEN (NAPROSYN) 500 MG TABLET    Take 1 tablet (500 mg total) by mouth 2 (two) times daily with a meal.     Pricilla Loveless, MD 09/20/16 [id number]

## 2016-09-20 NOTE — ED Triage Notes (Signed)
Pt c/o pain to rt hip/buttocks area shooting down rt leg; states has worsen in past day; states traveling over the weekend and pain was intense. Denies injury

## 2018-06-28 ENCOUNTER — Encounter (HOSPITAL_BASED_OUTPATIENT_CLINIC_OR_DEPARTMENT_OTHER): Payer: Self-pay

## 2018-06-28 ENCOUNTER — Other Ambulatory Visit: Payer: Self-pay

## 2018-06-28 DIAGNOSIS — M5412 Radiculopathy, cervical region: Secondary | ICD-10-CM | POA: Insufficient documentation

## 2018-06-28 DIAGNOSIS — Z79899 Other long term (current) drug therapy: Secondary | ICD-10-CM | POA: Diagnosis not present

## 2018-06-28 DIAGNOSIS — M542 Cervicalgia: Secondary | ICD-10-CM | POA: Diagnosis present

## 2018-06-28 NOTE — ED Triage Notes (Signed)
Pt c/o R shoulder pain x2 days. Denies injury. Pt was seen at [facility] yesterday and prescribed NSAIDS for pain. Today pain is worse and pt states she has tingling and numbness in R fingers.

## 2018-06-29 ENCOUNTER — Emergency Department (HOSPITAL_BASED_OUTPATIENT_CLINIC_OR_DEPARTMENT_OTHER)
Admission: EM | Admit: 2018-06-29 | Discharge: 2018-06-29 | Disposition: A | Discharge status: Home / Self Care | Attending: Emergency Medicine | Admitting: Emergency Medicine

## 2018-06-29 ENCOUNTER — Emergency Department (HOSPITAL_BASED_OUTPATIENT_CLINIC_OR_DEPARTMENT_OTHER)

## 2018-06-29 DIAGNOSIS — M5412 Radiculopathy, cervical region: Secondary | ICD-10-CM

## 2018-06-29 MED ORDER — PREDNISONE 20 MG PO TABS: 40.0000 mg | ORAL_TABLET | Freq: Every day | ORAL | 0 refills | Status: AC

## 2018-06-29 MED ORDER — PREDNISONE 50 MG PO TABS
60.0000 mg | ORAL_TABLET | Freq: Once | ORAL | Status: AC
  Administered 2018-06-29: 03:00:00 60 mg via ORAL
  Filled 2018-06-29: qty 1

## 2018-06-29 MED ORDER — HYDROCODONE-ACETAMINOPHEN 5-325 MG PO TABS: 1.0000 | ORAL_TABLET | ORAL | 0 refills | Status: AC | PRN

## 2018-06-29 MED ORDER — KETOROLAC TROMETHAMINE 60 MG/2ML IM SOLN
60.0000 mg | Freq: Once | INTRAMUSCULAR | Status: AC
  Administered 2018-06-29: 60 mg via INTRAMUSCULAR
  Filled 2018-06-29: qty 2

## 2018-06-29 MED ORDER — NAPROXEN 375 MG PO TABS: 375.0000 mg | ORAL_TABLET | Freq: Two times a day (BID) | ORAL | 0 refills | Status: AC

## 2018-06-29 NOTE — ED Provider Notes (Signed)
[facility] EMERGENCY DEPARTMENT Provider Note   CSN: [id number] Arrival date & time: 06/28/18  2059     History   Chief Complaint Chief Complaint  Patient presents with  . Shoulder Pain    HPI Jody Sanchez is a 47 y.o. female.  HPI 47 year old female here with right arm pain.  The patient states that at work 2 days ago, the patient was using a standing desk and having to reach to use her mouse and keyboard.  No direct injuries.  She states that over the last 48 hours, she is developed progressively worsening shoulder and arm pain.  She describes it as an aching sensation that goes from her right upper shoulder/trapezius area to her fingers.  She has a tingling sensation in her fourth and fifth digits that is also made worse with coughing, weightbearing, or sneezing.  She states that the pain is an aching sensation that occasionally becomes sharp and shoots down her arm along the medial and distal aspects.  Denies any lower extremity weakness or numbness.  She has a history of lumbar radiculopathy and disc disease, but has no known cervical disease.  No fevers or chills.  No history of IV drug use or recent infectious symptoms.  History reviewed. No pertinent past medical history.  There are no active problems to display for this patient.   Past Surgical History:  Procedure Laterality Date  . TUBAL LIGATION    . TUBAL LIGATION    . VEIN SURGERY    . WRIST SURGERY       OB History   None      Home Medications    Prior to Admission medications   Medication Sig Start Date End Date Taking? Authorizing Provider  diclofenac (VOLTAREN) 75 MG EC tablet Take 75 mg by mouth 2 (two) times daily.   Yes [provider]  calcium carbonate (OS-CAL) 600 MG TABS Take 1,200 mg by mouth daily.    [provider]  cyclobenzaprine (FLEXERIL) 10 MG tablet Take 1 tablet (10 mg total) by mouth 3 (three) times daily as needed for muscle spasms. 09/20/16   Pricilla Loveless, MD  fluticasone (FLONASE) 50 MCG/ACT nasal spray Place into both nostrils daily.    [provider]  HYDROcodone-acetaminophen (NORCO/VICODIN) 5-325 MG tablet Take 1-2 tablets by mouth every 4 (four) hours as needed for moderate pain or severe pain. 06/29/18   Shaune Pollack, MD  loratadine-pseudoephedrine (CLARITIN-D 24-HOUR) 10-240 MG per 24 hr tablet Take 1 tablet by mouth daily.    [provider]  naproxen (NAPROSYN) 375 MG tablet Take 1 tablet (375 mg total) by mouth 2 (two) times daily with a meal for 7 days. 06/29/18 07/06/18  Shaune Pollack, MD  omeprazole (PRILOSEC) 20 MG capsule Take 20 mg by mouth daily.    [provider]  ondansetron (ZOFRAN ODT) 4 MG disintegrating tablet Take 1 tablet (4 mg total) by mouth every 8 (eight) hours as needed for nausea or vomiting. 08/17/14   Elson Areas, PA-C  predniSONE (DELTASONE) 20 MG tablet Take 2 tablets (40 mg total) by mouth daily for 5 days. 06/29/18 07/04/18  Shaune Pollack, MD  Pseudoephedrine-Ibuprofen (ADVIL COLD/SINUS PO) Take by mouth.    [provider]    Family History No family history on file.  Social History Social History   Tobacco Use  . Smoking status: Never Smoker  . Smokeless tobacco: Never Used  Substance Use Topics  . Alcohol use: No  .  Drug use: No     Allergies   Sulfa antibiotics   Review of Systems Review of Systems  Constitutional: Negative for chills, fatigue and fever.  HENT: Negative for congestion and rhinorrhea.   Eyes: Negative for visual disturbance.  Respiratory: Negative for cough, shortness of breath and wheezing.   Cardiovascular: Negative for chest pain and leg swelling.  Gastrointestinal: Negative for abdominal pain, diarrhea, nausea and vomiting.  Genitourinary: Negative for dysuria and flank pain.  Musculoskeletal: Positive for arthralgias and neck pain. Negative for neck stiffness.  Skin: Negative for rash and wound.    Allergic/Immunologic: Negative for immunocompromised state.  Neurological: Negative for syncope, weakness and headaches.  All other systems reviewed and are negative.    Physical Exam Updated Vital Signs BP 117/64 (BP Location: Left Arm)   Pulse 72   Temp 98.7 F (37.1 C) (Oral)   Resp 16   Ht 5\' 7"  (1.702 m)   Wt 83.9 kg   SpO2 98%   BMI 28.98 kg/m   Physical Exam  Constitutional: She is oriented to person, place, and time. She appears well-developed and well-nourished. No distress.  HENT:  Head: Normocephalic and atraumatic.  Eyes: Conjunctivae are normal.  Neck: Neck supple.  Full range of motion.  Mild paraspinal tenderness along the lower cervical spine.  No midline tenderness or deformity.  No carotid bruits.  Cardiovascular: Normal rate, regular rhythm and normal heart sounds. Exam reveals no friction rub.  No murmur heard. Pulmonary/Chest: Effort normal and breath sounds normal. No respiratory distress. She has no wheezes. She has no rales.  Abdominal: She exhibits no distension.  Musculoskeletal: She exhibits no edema.  Neurological: She is alert and oriented to person, place, and time. She exhibits normal muscle tone.  Strength is 5 out of 5 bilateral upper extremities proximally and distally.  There is normal sensation to light touch and pinprick in distal bilateral upper and lower extremities.  Normal brachial reflexes.  Skin: Skin is warm. Capillary refill takes less than 2 seconds.  Psychiatric: She has a normal mood and affect.  Nursing note and vitals reviewed.     ED Treatments / Results  Labs (all labs ordered are listed, but only abnormal results are displayed) Labs Reviewed - No data to display  EKG None  Radiology Dg Cervical Spine Complete  Result Date: 06/29/2018 CLINICAL DATA:  47 year old female with history of right arm and shoulder pain and numbness. Numbness extending into the fingers. EXAM: CERVICAL SPINE - COMPLETE 4+ VIEW  COMPARISON:  Cervical spine radiograph 05/01/2008. FINDINGS: There is no evidence of cervical spine fracture or prevertebral soft tissue swelling. Alignment is normal. No other significant bone abnormalities are identified. IMPRESSION: Negative cervical spine radiographs. Electronically Signed   By: Trudie Reed M.D.   On: 06/29/2018 02:46   Dg Shoulder Right  Result Date: 06/29/2018 CLINICAL DATA:  47 year old female with history of right arm and shoulder pain and numbness. EXAM: RIGHT SHOULDER - 2+ VIEW COMPARISON:  Right shoulder radiograph 02/24/2016. FINDINGS: There is no evidence of fracture or dislocation. There is no evidence of arthropathy or other focal bone abnormality. Soft tissues are unremarkable. IMPRESSION: Negative. Electronically Signed   By: Trudie Reed M.D.   On: 06/29/2018 02:48    Procedures Procedures (including critical care time)  Medications Ordered in ED Medications  predniSONE (DELTASONE) tablet 60 mg (60 mg Oral Given 06/29/18 0303)  ketorolac (TORADOL) injection 60 mg (60 mg Intramuscular Given 06/29/18 0303)     Initial  Impression / Assessment and Plan / ED Course  I have reviewed the triage vital signs and the nursing notes.  Pertinent labs & imaging results that were available during my care of the patient were reviewed by me and considered in my medical decision making (see chart for details).     47 year old female here with intermittent right arm pain.  I suspect this is actually due to a cervical radiculopathy and her symptoms follow the C7-C8 distribution.  She has a historyof known degenerative disc disease in her lower spine.  She has no evidence of significant cord compression with fully intact distal strength and sensation.  No infectious symptoms.  Plain films are unremarkable.  No signs of vascular compromise.  Will treat for radiculopathy with steroids and analgesics and refer her back to her spine surgeon.  Discussed strict return  precautions.  Final Clinical Impressions(s) / ED Diagnoses   Final diagnoses:  Cervical radiculopathy    ED Discharge Orders         Ordered    predniSONE (DELTASONE) 20 MG tablet  Daily     06/29/18 0305    HYDROcodone-acetaminophen (NORCO/VICODIN) 5-325 MG tablet  Every 4 hours PRN     06/29/18 0305    naproxen (NAPROSYN) 375 MG tablet  2 times daily with meals     06/29/18 0305           Shaune Pollack, MD 06/29/18 0327

## 2020-01-31 IMAGING — CR DG CERVICAL SPINE COMPLETE 4+V
5 series · 5 of 5 positions shown · non-contrast
Comparison: Cervical spine radiograph 05/01/2008.

CLINICAL DATA: 47-year-old female with history of right arm and
shoulder pain and numbness. Numbness extending into the fingers.

EXAM:
CERVICAL SPINE - COMPLETE 4+ VIEW

[w c-spine lat]
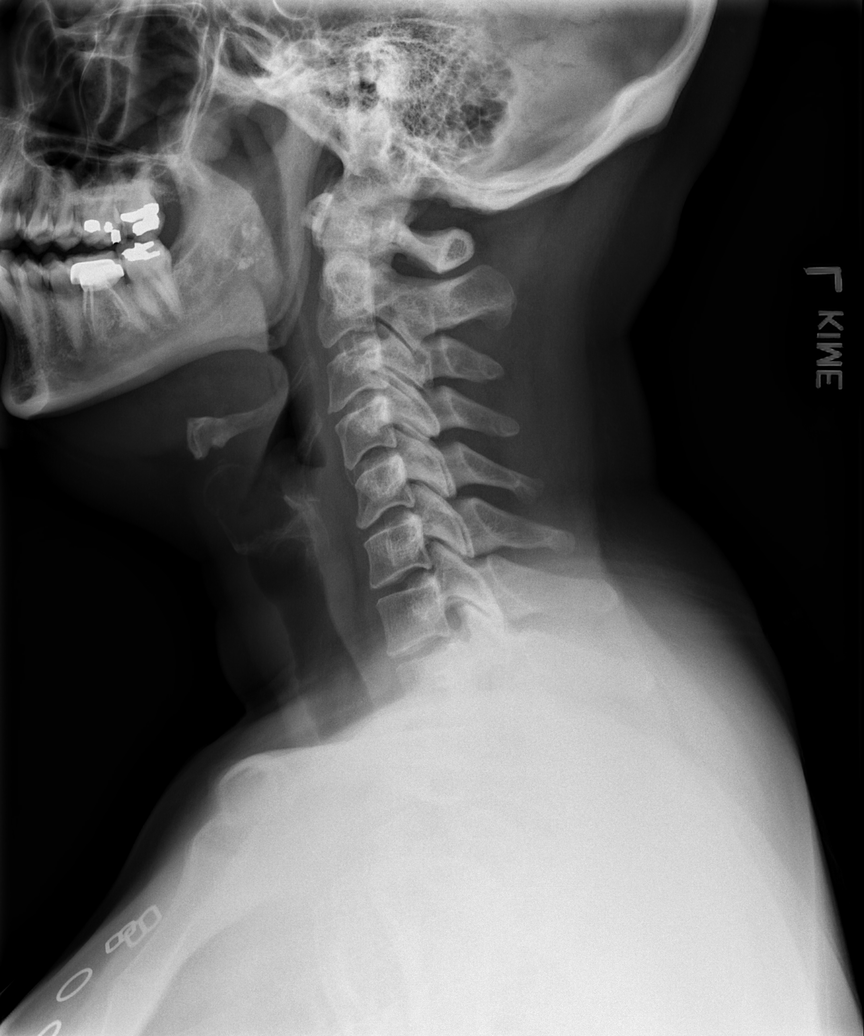

[w c-spine oblique (1 of 2)]
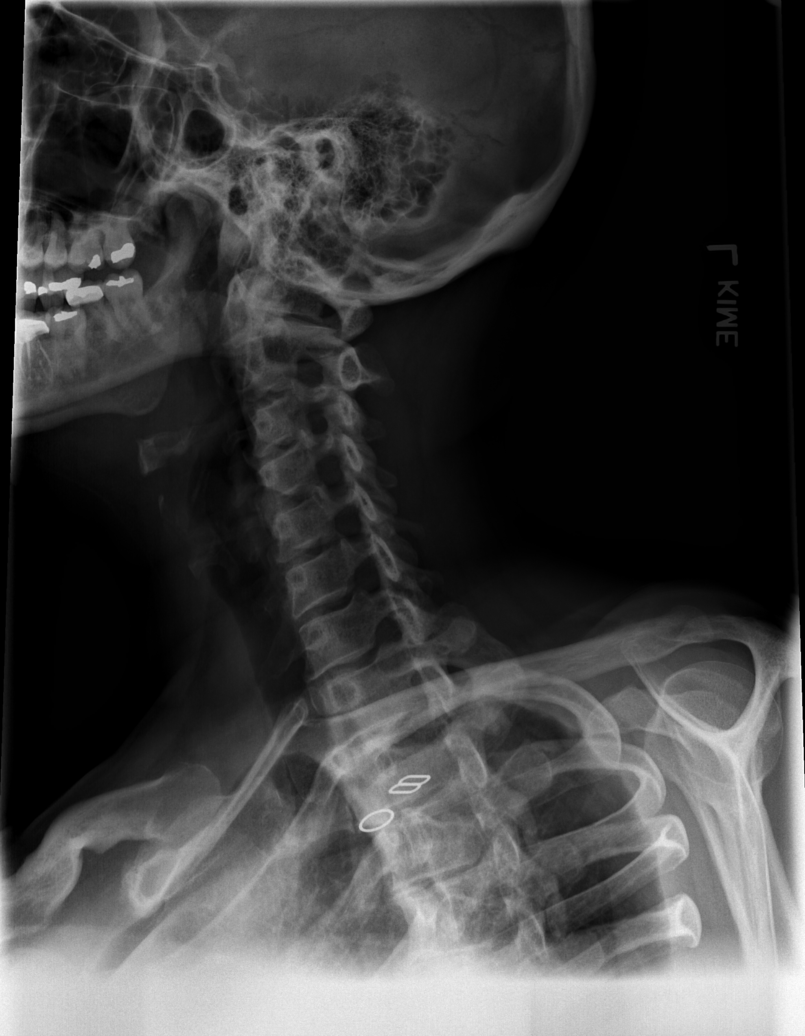

[w c-spine oblique (2 of 2)]
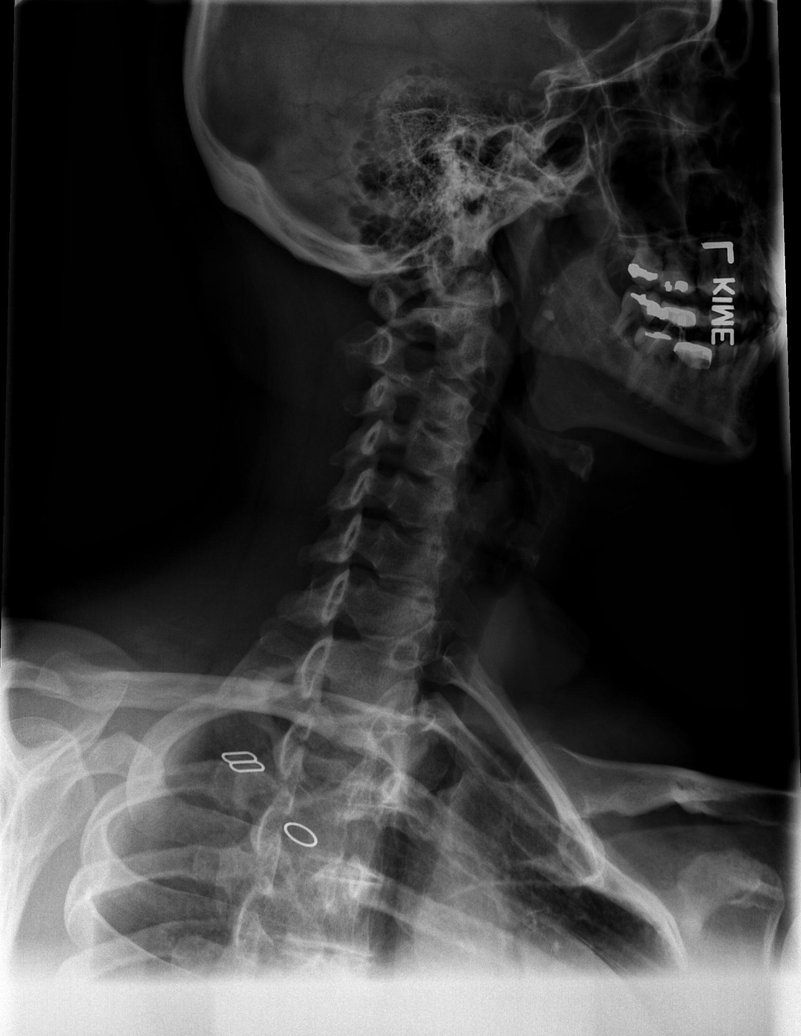

[w c-spine a.p.]
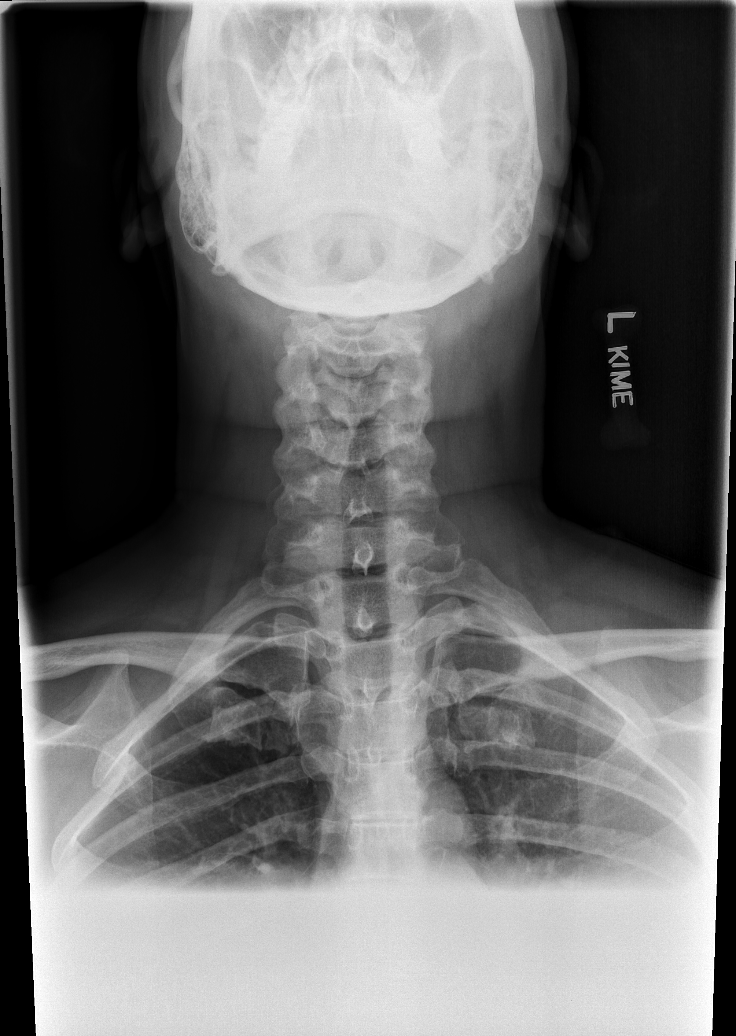

[w c-spine odontoid]
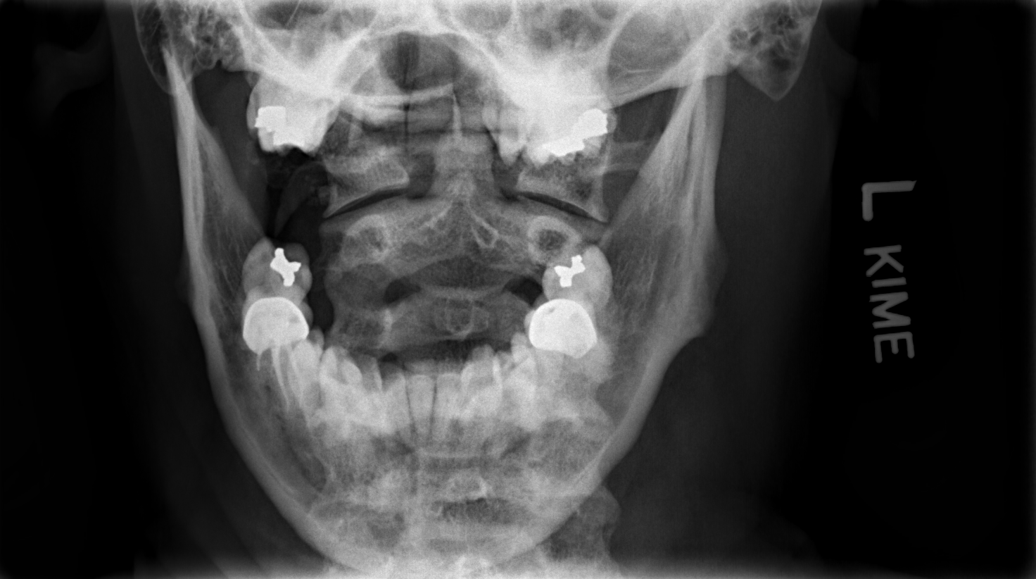

[5 of 5 positions shown; findings below may reference images not displayed]

FINDINGS: There is no evidence of cervical spine fracture or prevertebral soft
tissue swelling. Alignment is normal. No other significant bone
abnormalities are identified.
IMPRESSION: Negative cervical spine radiographs.

## 2020-01-31 IMAGING — CR DG SHOULDER 2+V*R*
3 series · 3 of 3 positions shown · non-contrast
Comparison: Right shoulder radiograph 02/24/2016.

CLINICAL DATA: 47-year-old female with history of right arm and
shoulder pain and numbness.

EXAM:
RIGHT SHOULDER - 2+ VIEW

[w shoulder grashey right]
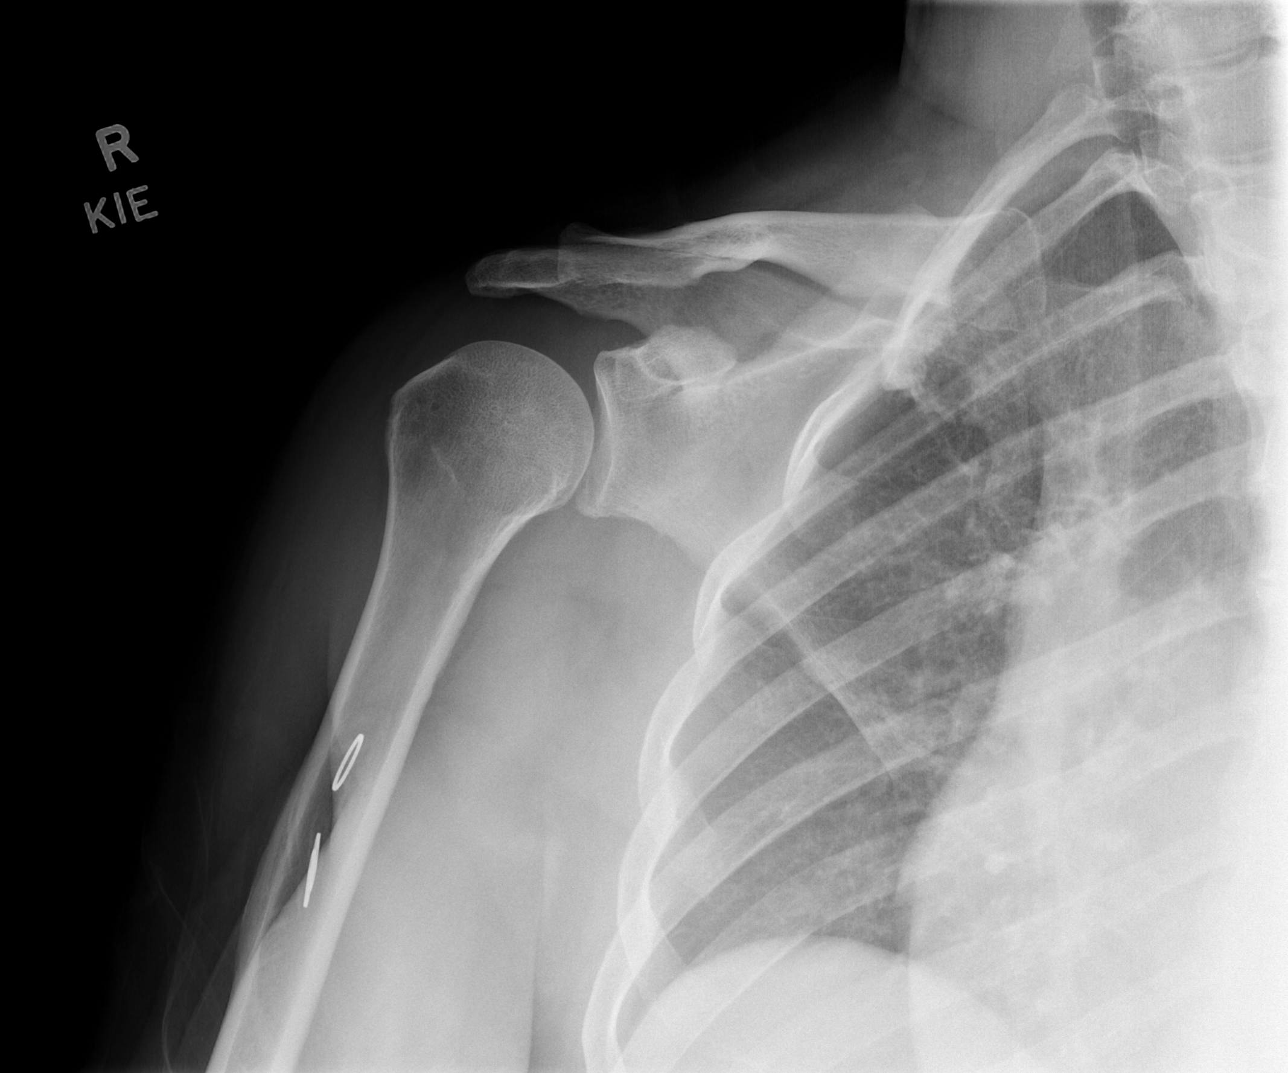

[w shoulder y view right]
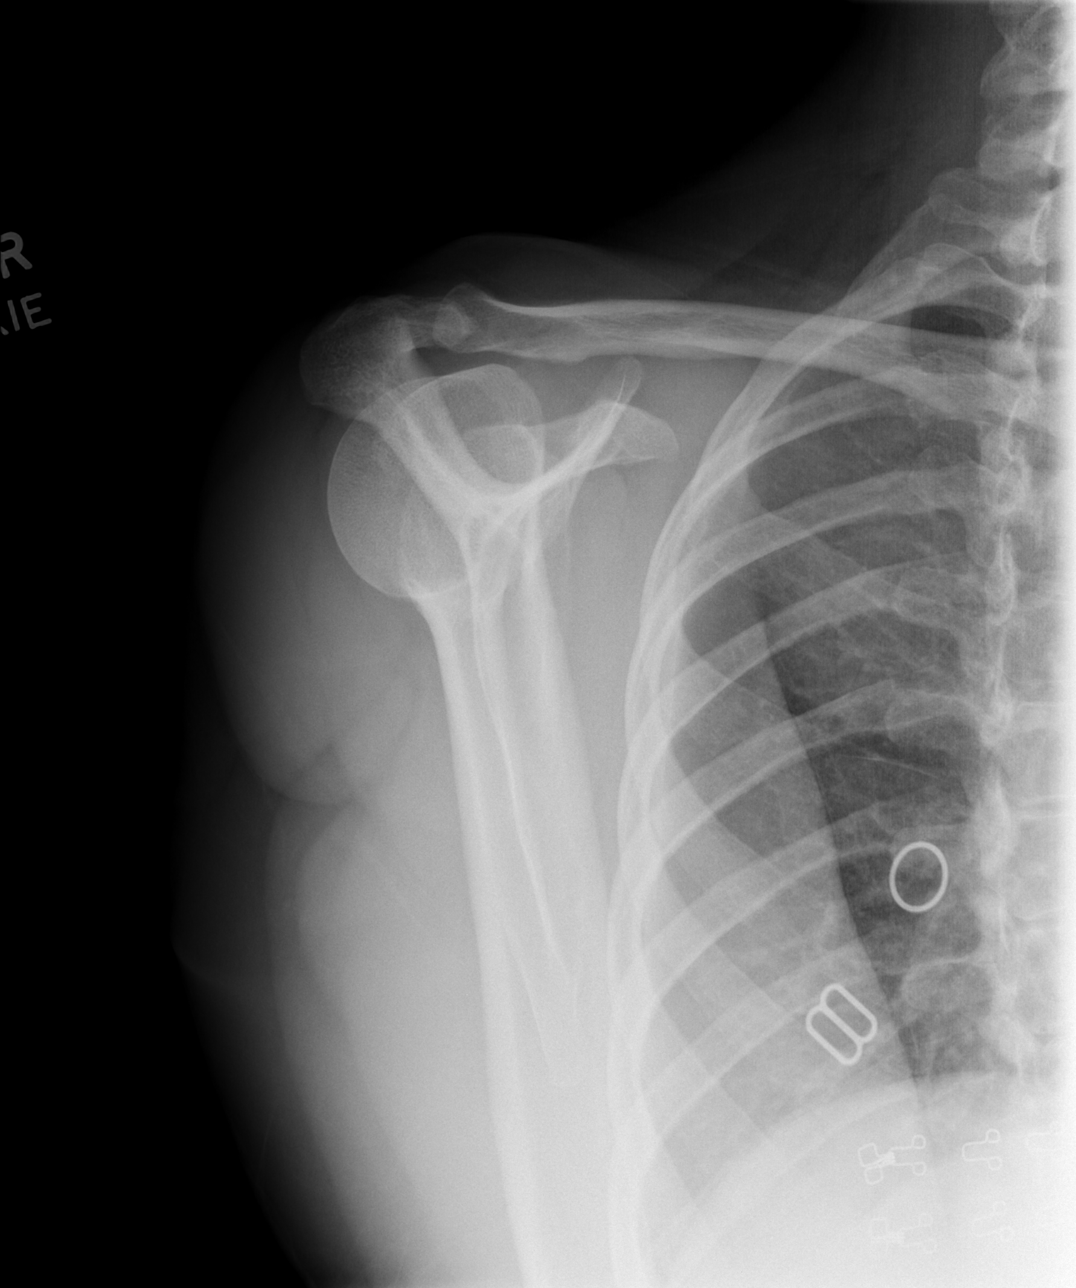

[x shoulder axillary right]
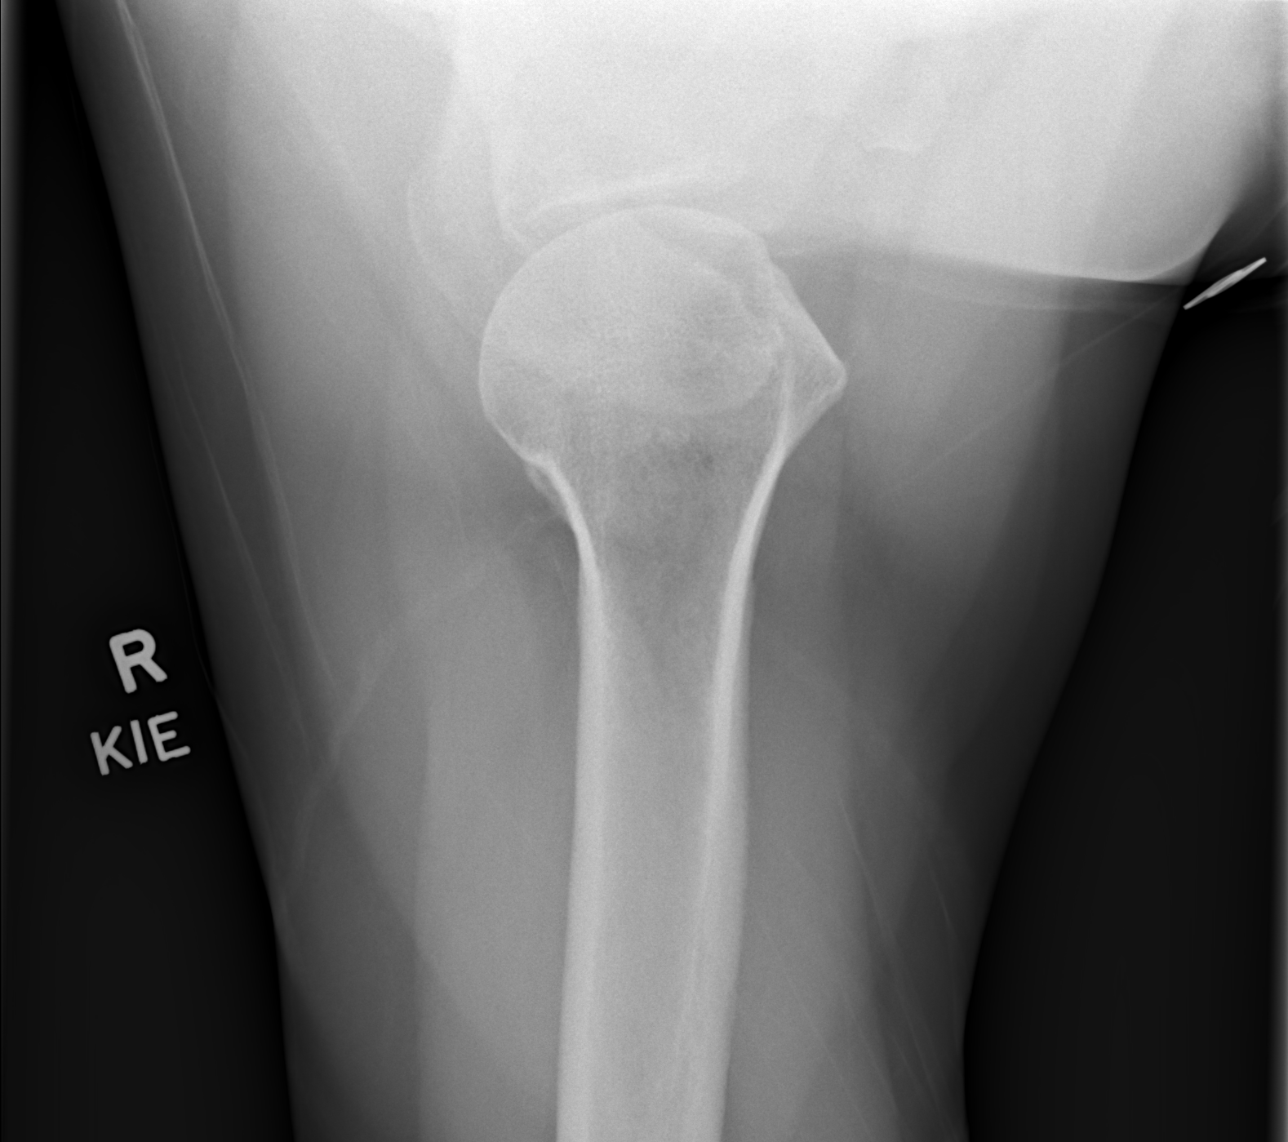

[3 of 3 positions shown; findings below may reference images not displayed]

FINDINGS: There is no evidence of fracture or dislocation. There is no
evidence of arthropathy or other focal bone abnormality. Soft
tissues are unremarkable.
IMPRESSION: Negative.
